# Patient Record
Sex: Female | Born: 1945 | Race: Black or African American | Hispanic: No | Smoking: Never smoker
Health system: Southern US, Community
[De-identification: ages and names within clinical notes are randomized; demographics above are authoritative.]

## PROBLEM LIST (undated history)

## (undated) DIAGNOSIS — I10 Essential (primary) hypertension: Secondary | ICD-10-CM

## (undated) DIAGNOSIS — G4730 Sleep apnea, unspecified: Secondary | ICD-10-CM

## (undated) HISTORY — PX: TONSILLECTOMY: SUR1361

## (undated) HISTORY — PX: ADENOIDECTOMY: SUR15

---

## 1998-04-29 ENCOUNTER — Ambulatory Visit: Admission: RE | Admit: 1998-04-29 | Discharge: 1998-04-29 | Payer: Self-pay | Admitting: *Deleted

## 1998-06-02 ENCOUNTER — Observation Stay: Admission: EM | Admit: 1998-06-02 | Discharge: 1998-06-03 | Payer: Self-pay | Admitting: *Deleted

## 1999-03-02 ENCOUNTER — Encounter: Payer: Self-pay | Admitting: Emergency Medicine

## 1999-03-02 ENCOUNTER — Emergency Department (HOSPITAL_COMMUNITY): Admission: EM | Admit: 1999-03-02 | Discharge: 1999-03-02 | Payer: Self-pay | Admitting: Emergency Medicine

## 1999-06-06 ENCOUNTER — Other Ambulatory Visit: Admission: RE | Admit: 1999-06-06 | Discharge: 1999-06-06 | Payer: Self-pay | Admitting: Obstetrics

## 2004-07-14 ENCOUNTER — Emergency Department (HOSPITAL_COMMUNITY): Admission: EM | Admit: 2004-07-14 | Discharge: 2004-07-14 | Payer: Self-pay | Admitting: Emergency Medicine

## 2006-03-21 IMAGING — CT CT PELVIS W/O CM
1 series · 16 of 32 positions shown, 20 images · non-contrast
Comparison: none

CLINICAL DATA: Left flank pain/hematuria.
TECHNIQUE: The study was done with kidney stone protocol.  No oral or IV contrast utilized. 
 CT ABDOMEN WITHOUT CONTRAST
 There are no definite renal calculi.  No hydronephrosis or perinephric stranding.  There are no left ureteral calculi evident.  On image #31, there is a question of a small right, nonobstructing, proximal ureteral calculus.  This could be an artifact.  This needs to be correlated clinically.  I am told that the patient's symptoms are left-sided. 
 Other organs unremarkable given the limitations to scanning without oral or IV contrast.

[Series 2: renal stone · axial · 0.78mm/px · z∈[-359,-49]mm · 16 of 70 slices shown, 20 images]
[im 5/70  soft-tissue]
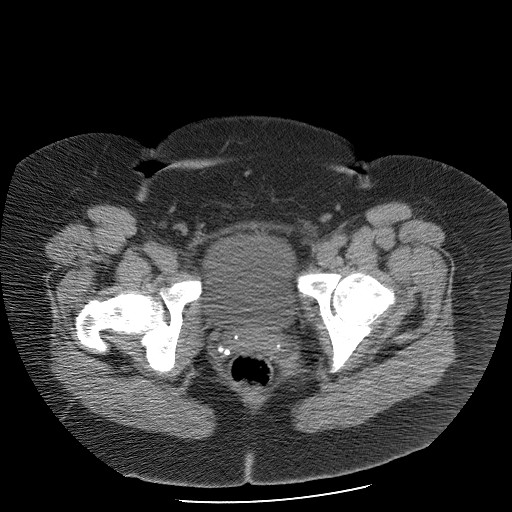
[im 5/70  bone]
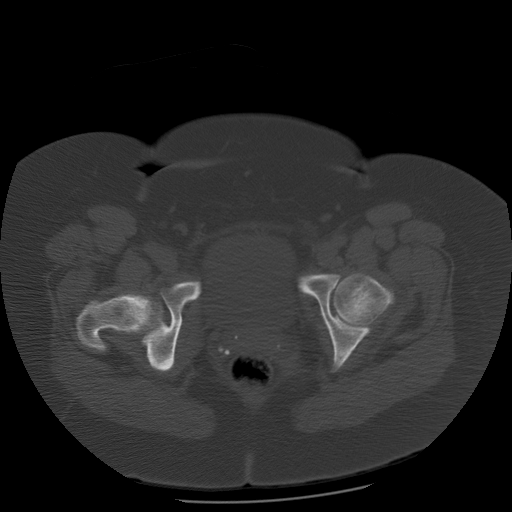
[im 9/70  soft-tissue]
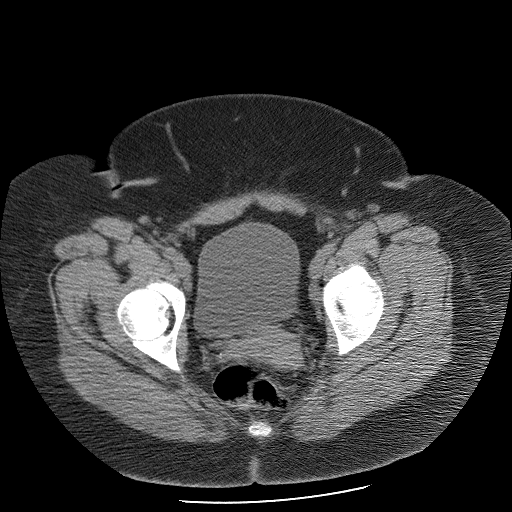
[im 14/70  soft-tissue]
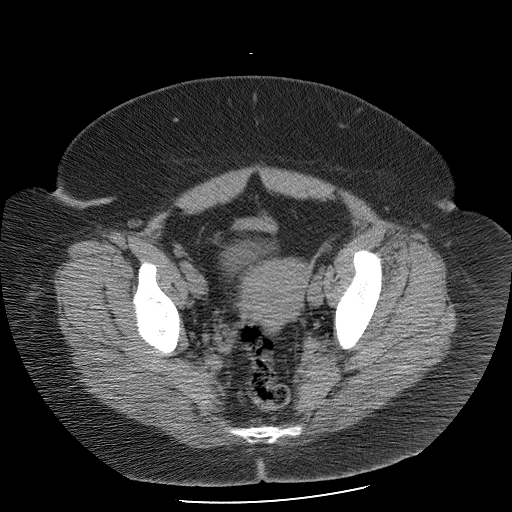
[im 18/70  soft-tissue]
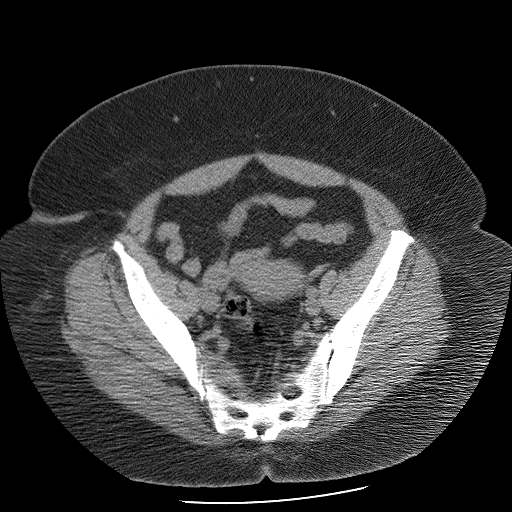
[im 23/70  soft-tissue]
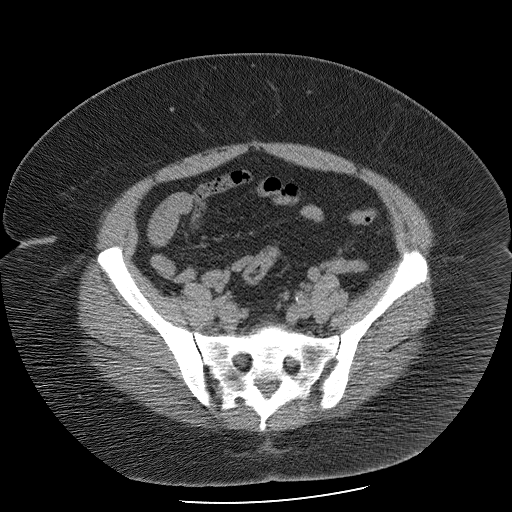
[im 27/70  soft-tissue]
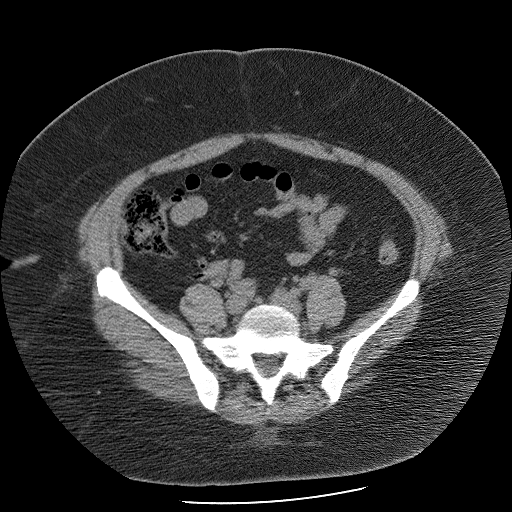
[im 32/70  soft-tissue]
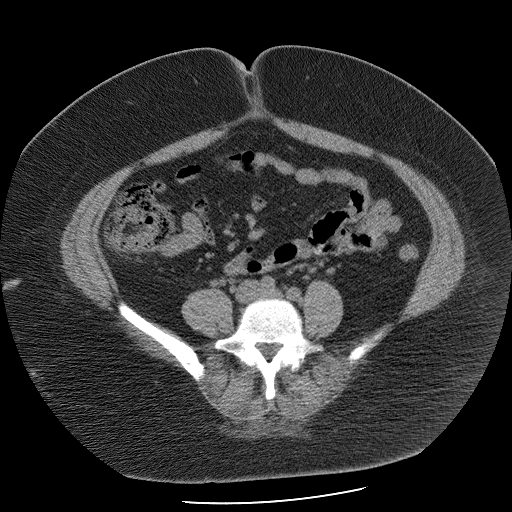
[im 38/70  soft-tissue]
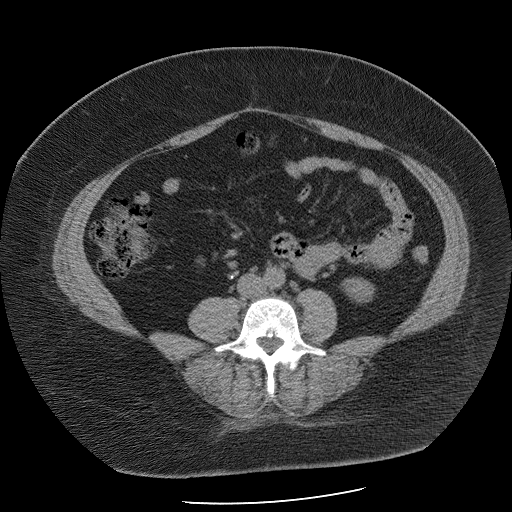
[im 43/70  soft-tissue]
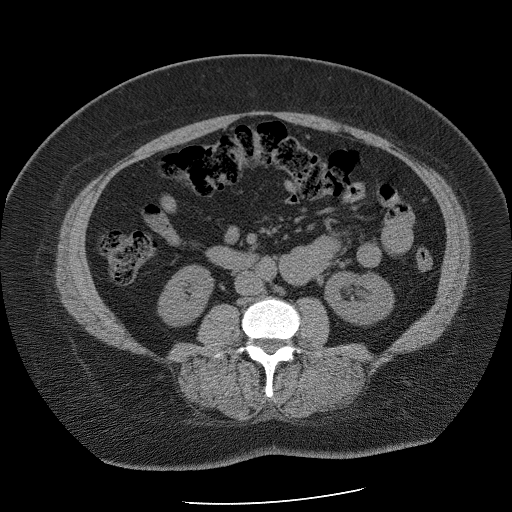
[im 43/70  bone]
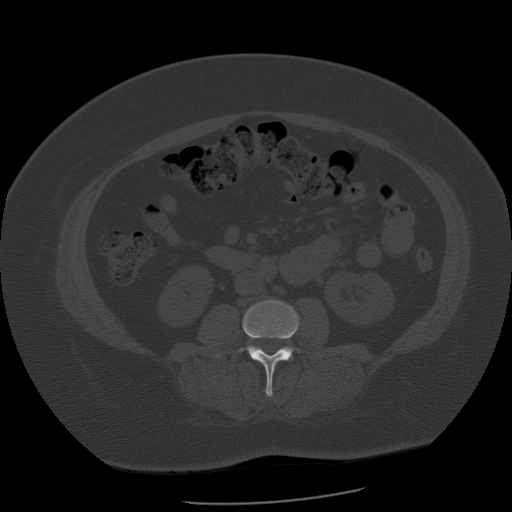
[im 47/70  soft-tissue]
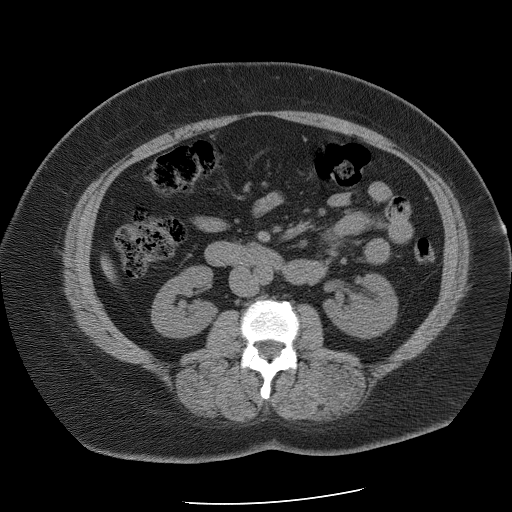
[im 52/70  soft-tissue]
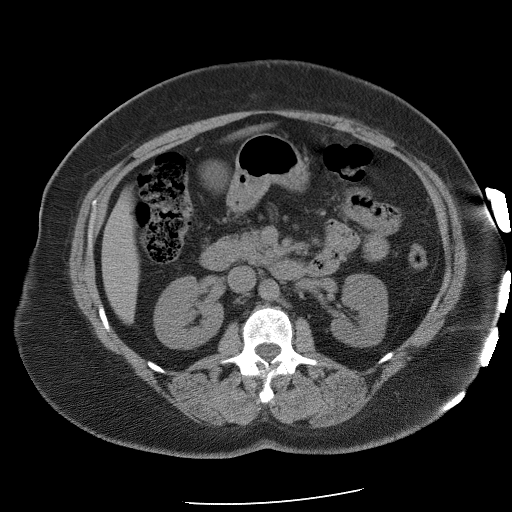
[im 56/70  soft-tissue]
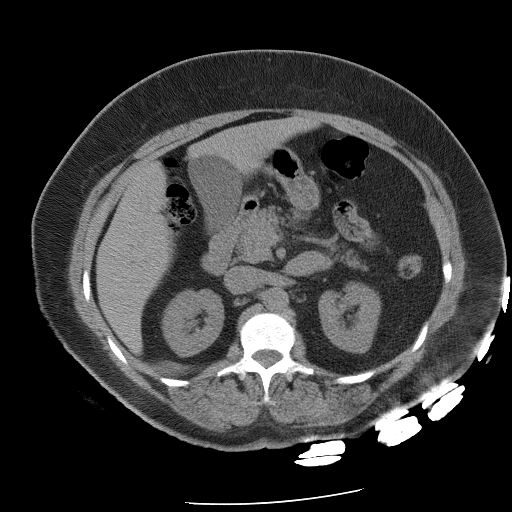
[im 61/70  soft-tissue]
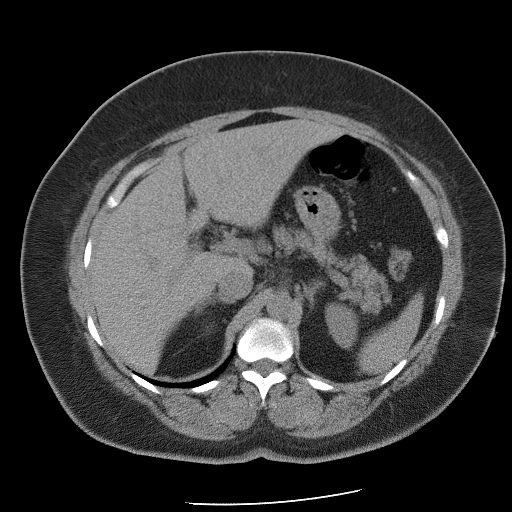
[im 61/70  lung]
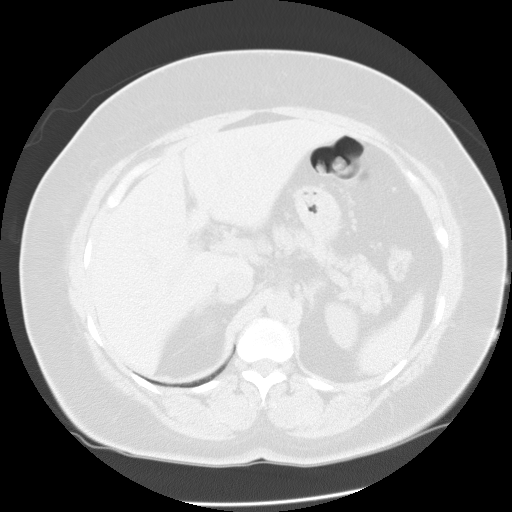
[im 63/70  lung]
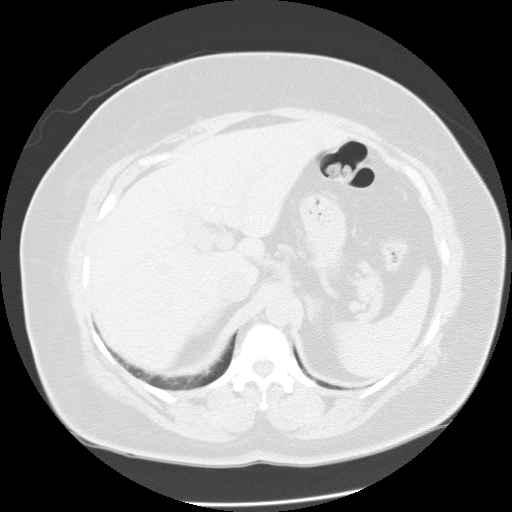
[im 65/70  soft-tissue]
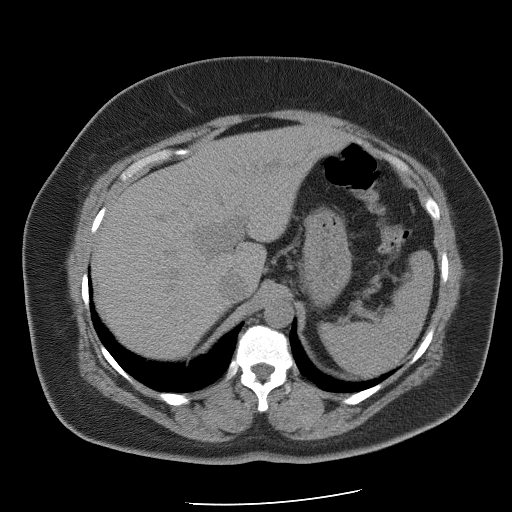
[im 65/70  lung]
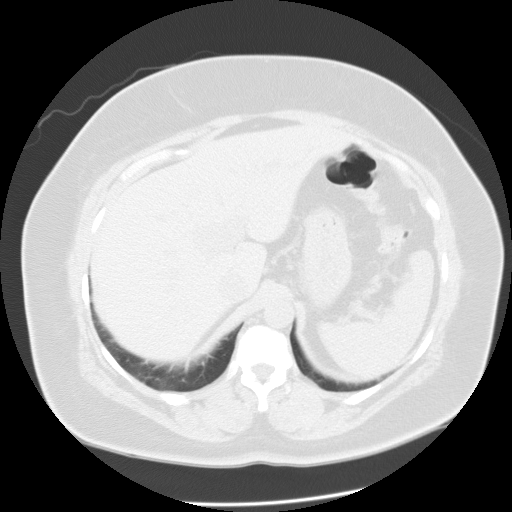
[im 67/70  lung]
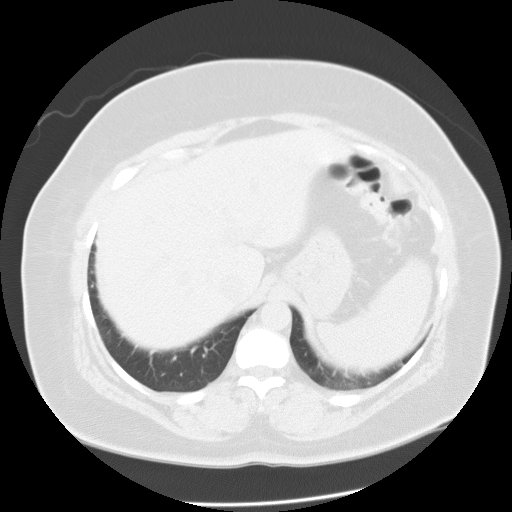

[16 of 32 positions shown; findings below may reference images not displayed]

IMPRESSION: Normal except for a possible 1-2 mm nonobstructing right upper ureteral calculus. 
 CT OF PELVIS WITHOUT CONTRAST
 No evidence for left ureteral dilatation or calculi.  No bladder stones.  Right ureter unremarkable.  There are some uterine calcifications and some phleboliths.  The appendix is air filled and normal.
IMPRESSION: No evidence for urinary tract calculi.  No significant findings.

## 2006-04-12 ENCOUNTER — Emergency Department (HOSPITAL_COMMUNITY): Admission: EM | Admit: 2006-04-12 | Discharge: 2006-04-12 | Payer: Self-pay | Admitting: Family Medicine

## 2006-09-02 ENCOUNTER — Emergency Department (HOSPITAL_COMMUNITY): Admission: EM | Admit: 2006-09-02 | Discharge: 2006-09-03 | Payer: Self-pay | Admitting: Emergency Medicine

## 2009-09-24 ENCOUNTER — Emergency Department (HOSPITAL_COMMUNITY): Admission: EM | Admit: 2009-09-24 | Discharge: 2009-09-24 | Payer: Self-pay | Admitting: Family Medicine

## 2011-03-22 LAB — POCT I-STAT, CHEM 8
BUN: 13 mg/dL (ref 6–23)
Calcium, Ion: 1.24 mmol/L (ref 1.12–1.32)
Chloride: 104 mEq/L (ref 96–112)
Glucose, Bld: 89 mg/dL (ref 70–99)

## 2011-06-01 IMAGING — CR DG CHEST 2V
2 series · 2 of 2 positions shown · non-contrast
Comparison: None.

CLINICAL DATA: Leg swelling

CHEST - 2 VIEW

[view not recorded (1 of 2)]
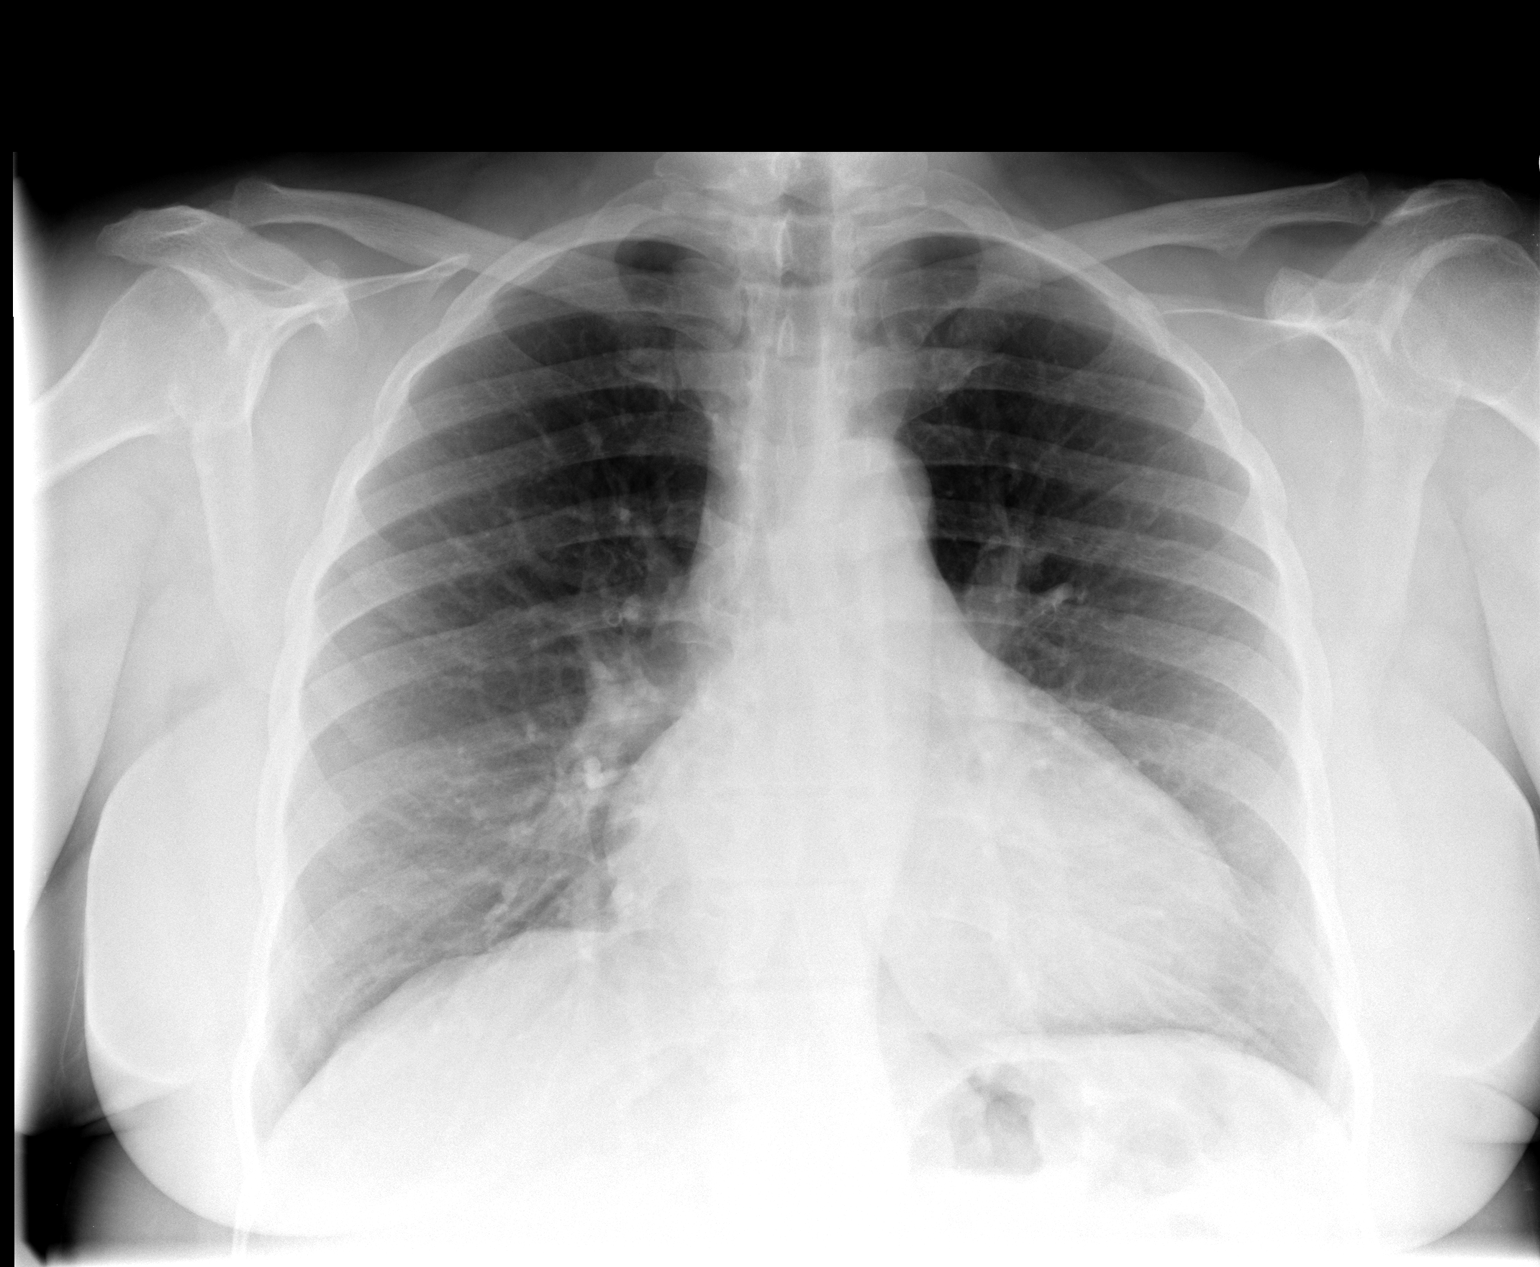

[view not recorded (2 of 2)]
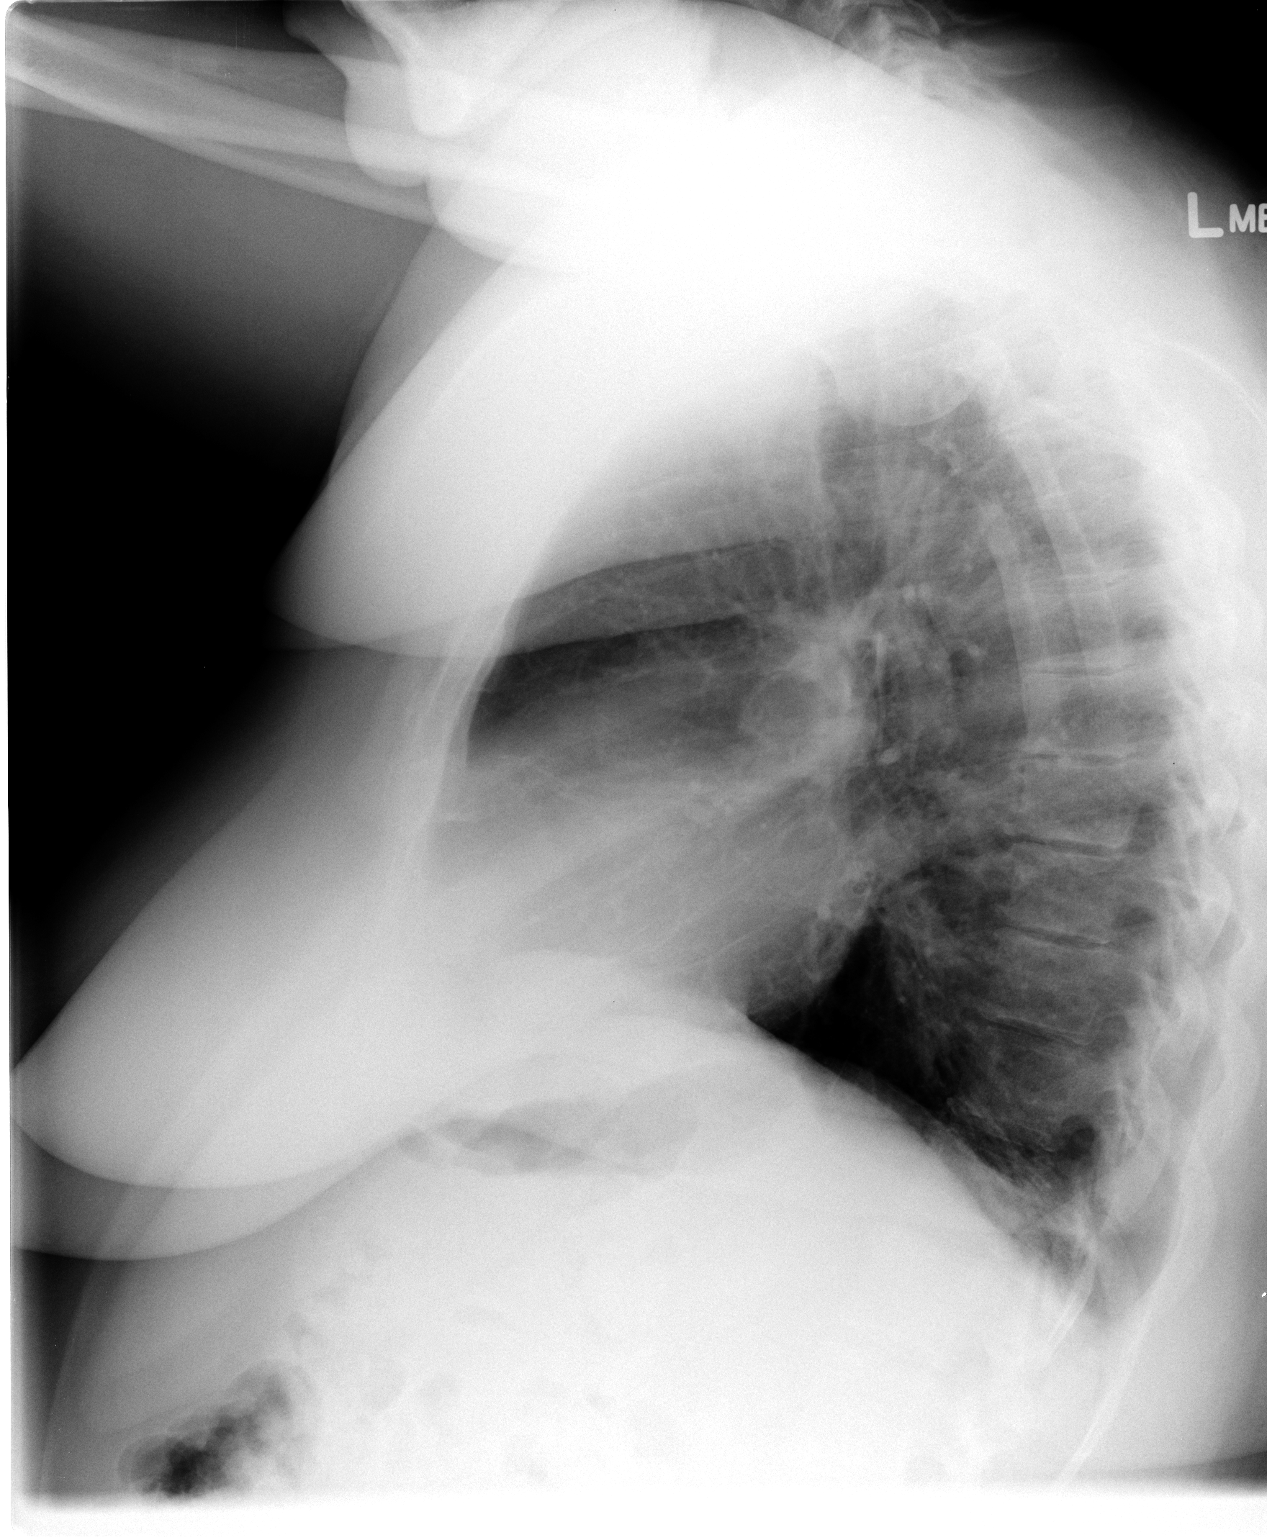

[2 of 2 positions shown; findings below may reference images not displayed]

FINDINGS: Lung volumes are low. The lungs are clear without focal
infiltrate, edema, pneumothorax or pleural effusion.
Cardiopericardial silhouette is at upper limits of normal for size.
Imaged bony structures of the thorax are intact.
IMPRESSION: Low volume film without acute cardiopulmonary findings.

## 2014-07-14 ENCOUNTER — Other Ambulatory Visit: Payer: Self-pay | Admitting: Internal Medicine

## 2014-07-14 DIAGNOSIS — E2839 Other primary ovarian failure: Secondary | ICD-10-CM

## 2014-12-22 ENCOUNTER — Other Ambulatory Visit: Payer: Self-pay

## 2018-10-02 ENCOUNTER — Other Ambulatory Visit: Payer: Self-pay

## 2018-10-02 ENCOUNTER — Emergency Department (HOSPITAL_COMMUNITY)
Admission: EM | Admit: 2018-10-02 | Discharge: 2018-10-02 | Disposition: A | Discharge status: Home / Self Care | Payer: Medicare HMO | Attending: Emergency Medicine | Admitting: Emergency Medicine

## 2018-10-02 ENCOUNTER — Encounter (HOSPITAL_COMMUNITY): Payer: Self-pay

## 2018-10-02 ENCOUNTER — Emergency Department (HOSPITAL_COMMUNITY): Payer: Medicare HMO

## 2018-10-02 DIAGNOSIS — Z9114 Patient's other noncompliance with medication regimen: Secondary | ICD-10-CM

## 2018-10-02 DIAGNOSIS — Z7982 Long term (current) use of aspirin: Secondary | ICD-10-CM | POA: Insufficient documentation

## 2018-10-02 DIAGNOSIS — I10 Essential (primary) hypertension: Secondary | ICD-10-CM

## 2018-10-02 DIAGNOSIS — I509 Heart failure, unspecified: Secondary | ICD-10-CM | POA: Diagnosis not present

## 2018-10-02 DIAGNOSIS — I110 Hypertensive heart disease with heart failure: Secondary | ICD-10-CM | POA: Insufficient documentation

## 2018-10-02 DIAGNOSIS — R0602 Shortness of breath: Secondary | ICD-10-CM | POA: Diagnosis present

## 2018-10-02 DIAGNOSIS — Z79899 Other long term (current) drug therapy: Secondary | ICD-10-CM | POA: Insufficient documentation

## 2018-10-02 DIAGNOSIS — Z91148 Patient's other noncompliance with medication regimen for other reason: Secondary | ICD-10-CM

## 2018-10-02 HISTORY — DX: Essential (primary) hypertension: I10

## 2018-10-02 HISTORY — DX: Sleep apnea, unspecified: G47.30

## 2018-10-02 LAB — COMPREHENSIVE METABOLIC PANEL
ALBUMIN: 3.6 g/dL (ref 3.5–5.0)
ALT: 26 U/L (ref 0–44)
ANION GAP: 9 (ref 5–15)
AST: 23 U/L (ref 15–41)
Alkaline Phosphatase: 80 U/L (ref 38–126)
BUN: 14 mg/dL (ref 8–23)
CHLORIDE: 104 mmol/L (ref 98–111)
CO2: 27 mmol/L (ref 22–32)
Calcium: 9.4 mg/dL (ref 8.9–10.3)
Creatinine, Ser: 0.78 mg/dL (ref 0.44–1.00)
GFR calc Af Amer: >60 mL/min (ref >60)
GFR calc non Af Amer: >60 mL/min (ref >60)
GLUCOSE: 106 mg/dL — AB (ref 70–99)
POTASSIUM: 4 mmol/L (ref 3.5–5.1)
Sodium: 140 mmol/L (ref 135–145)
Total Bilirubin: 0.4 mg/dL (ref 0.3–1.2)
Total Protein: 7.2 g/dL (ref 6.5–8.1)

## 2018-10-02 LAB — BRAIN NATRIURETIC PEPTIDE: B NATRIURETIC PEPTIDE 5: 539.8 pg/mL — AB (ref 0.0–100.0)

## 2018-10-02 LAB — CBC WITH DIFFERENTIAL/PLATELET
Abs Immature Granulocytes: 0.01 10*3/uL (ref 0.00–0.07)
BASOS ABS: 0 10*3/uL (ref 0.0–0.1)
BASOS PCT: 1 %
EOS PCT: 2 %
Eosinophils Absolute: 0.1 10*3/uL (ref 0.0–0.5)
HCT: 41.1 % (ref 36.0–46.0)
HEMOGLOBIN: 12.6 g/dL (ref 12.0–15.0)
Immature Granulocytes: 0 %
LYMPHS PCT: 25 %
Lymphs Abs: 1.6 10*3/uL (ref 0.7–4.0)
MCH: 28 pg (ref 26.0–34.0)
MCHC: 30.7 g/dL (ref 30.0–36.0)
MCV: 91.3 fL (ref 80.0–100.0)
Monocytes Absolute: 0.6 10*3/uL (ref 0.1–1.0)
Monocytes Relative: 9 %
NEUTROS ABS: 4.1 10*3/uL (ref 1.7–7.7)
NRBC: 0 % (ref 0.0–0.2)
Neutrophils Relative %: 63 %
PLATELETS: 304 10*3/uL (ref 150–400)
RBC: 4.5 MIL/uL (ref 3.87–5.11)
RDW: 14.9 % (ref 11.5–15.5)
WBC: 6.4 10*3/uL (ref 4.0–10.5)

## 2018-10-02 LAB — TROPONIN I: Troponin I: <0.03 ng/mL (ref <0.03)

## 2018-10-02 LAB — D-DIMER, QUANTITATIVE: D-Dimer, Quant: 0.53 ug/mL-FEU — ABNORMAL HIGH (ref 0.00–0.50)

## 2018-10-02 MED ORDER — FUROSEMIDE 10 MG/ML IJ SOLN
20.0000 mg | Freq: Once | INTRAMUSCULAR | Status: AC
  Administered 2018-10-02: 20 mg via INTRAVENOUS
  Filled 2018-10-02: qty 4

## 2018-10-02 MED ORDER — VALSARTAN 40 MG PO TABS: 40.0000 mg | ORAL_TABLET | Freq: Two times a day (BID) | ORAL | 1 refills | Status: AC

## 2018-10-02 MED ORDER — ALBUTEROL SULFATE (2.5 MG/3ML) 0.083% IN NEBU: 5.0000 mg | INHALATION_SOLUTION | Freq: Once | RESPIRATORY_TRACT | Status: DC

## 2018-10-02 MED ORDER — FUROSEMIDE 20 MG PO TABS: 20.0000 mg | ORAL_TABLET | Freq: Two times a day (BID) | ORAL | 1 refills | Status: AC

## 2018-10-02 NOTE — ED Notes (Signed)
Pt is alert and oriented x 4 and is verbally responsive. Pt denies pain at this time. Pt dtr and grandtr are at bedside. Pt has bilateral LE edema.

## 2018-10-02 NOTE — Discharge Instructions (Addendum)
Your blood pressure today is very high, causing you to have heart failure.  It would be better to stay in the hospital but you elected to be discharged with medications sent to your pharmacy.  Please take them as directed.  Try to avoid any added salt in your diet.  See the attached instructions on a appropriate diet for high blood pressure.  Return here, if needed for problems.

## 2018-10-02 NOTE — ED Provider Notes (Signed)
[facility]-EMERGENCY DEPT Provider Note   CSN: [id number] Arrival date & time: 10/02/18  1025     History   Chief Complaint Chief Complaint  Patient presents with  . Shortness of Breath    HPI Shirley Wheeler is a 72 y.o. female.  HPI   She presents for evaluation of shortness of breath, difficulty sleeping at night because getting out of breath, dyspnea on exertion, and chest tightness for 1 week.  The chest tightness comes and goes.  She denies diaphoresis, vomiting, cough, weakness or dizziness.  She stopped taking her blood pressure medicine several months ago, by choice.  Has not seen her doctor recently for anything.  There are no other no modifying factors.  Past Medical History:  Diagnosis Date  . Hypertension   . Sleep apnea     There are no active problems to display for this patient.   Past Surgical History:  Procedure Laterality Date  . ADENOIDECTOMY    . TONSILLECTOMY       OB History   None      Home Medications    Prior to Admission medications   Medication Sig Start Date End Date Taking? Authorizing Provider  amLODipine (NORVASC) 10 MG tablet Take 10 mg by mouth daily.   Yes [provider]  aspirin 325 MG EC tablet Take 325 mg by mouth daily.   Yes [provider]  Aspirin-Acetaminophen-Caffeine [phone] MG PACK Take 1 Package by mouth daily as needed (headache).    Yes [provider]  Multiple Vitamin (MULTIVITAMIN WITH MINERALS) TABS tablet Take 1 tablet by mouth daily.   Yes [provider]  furosemide (LASIX) 20 MG tablet Take 1 tablet (20 mg total) by mouth 2 (two) times daily. 10/02/18   Mancel Bale, MD  valsartan (DIOVAN) 40 MG tablet Take 1 tablet (40 mg total) by mouth 2 (two) times daily. 10/02/18   Mancel Bale, MD    Family History Family History  Problem Relation Age of Onset  . Hypertension Mother     Social History Social History   Tobacco Use  . Smoking  status: Never Smoker  . Smokeless tobacco: Never Used  Substance Use Topics  . Alcohol use: Never    Frequency: Never  . Drug use: Never     Allergies   Patient has no known allergies.   Review of Systems Review of Systems  All other systems reviewed and are negative.    Physical Exam Updated Vital Signs BP (!) 170/118   Pulse (!) 113   Temp 97.7 F (36.5 C) (Oral)   Resp 19   Ht 5\' 5"  (1.651 m)   Wt 113.4 kg   SpO2 97%   BMI 41.60 kg/m   Physical Exam  Constitutional: She is oriented to person, place, and time. She appears well-developed and well-nourished.  HENT:  Head: Normocephalic and atraumatic.  Eyes: Pupils are equal, round, and reactive to light. Conjunctivae and EOM are normal.  Neck: Normal range of motion and phonation normal. Neck supple.  Cardiovascular: Normal rate and regular rhythm.  Pulmonary/Chest: Effort normal. No respiratory distress. She has decreased breath sounds in the right lower field and the left lower field. She has no wheezes. She has no rhonchi. She has no rales. She exhibits no tenderness.  Abdominal: Soft. She exhibits no distension. There is no tenderness. There is no guarding.  Musculoskeletal: Normal range of motion.       Right lower leg: She exhibits  edema. She exhibits no tenderness.       Left lower leg: She exhibits edema. She exhibits no tenderness.  Neurological: She is alert and oriented to person, place, and time. She exhibits normal muscle tone.  Skin: Skin is warm and dry.  Psychiatric: She has a normal mood and affect. Her behavior is normal. Judgment and thought content normal.  Nursing note and vitals reviewed.    ED Treatments / Results  Labs (all labs ordered are listed, but only abnormal results are displayed) Labs Reviewed  COMPREHENSIVE METABOLIC PANEL - Abnormal; Notable for the following components:      Result Value   Glucose, Bld 106 (*)    All other components within normal limits  D-DIMER,  QUANTITATIVE (NOT AT [facility]) - Abnormal; Notable for the following components:   D-Dimer, Quant 0.53 (*)    All other components within normal limits  BRAIN NATRIURETIC PEPTIDE - Abnormal; Notable for the following components:   B Natriuretic Peptide 539.8 (*)    All other components within normal limits  TROPONIN I  CBC WITH DIFFERENTIAL/PLATELET    EKG EKG Interpretation  Date/Time:  Thursday October 02 2018 10:33:58 EDT Ventricular Rate:  112 PR Interval:    QRS Duration: 108 QT Interval:  346 QTC Calculation: 473 R Axis:   -40 Text Interpretation:  Sinus tachycardia Incomplete left bundle branch block Low voltage, extremity and precordial leads Baseline wander in lead(s) V2 V3 V6 Since last tracing rate faster Reconfirmed by Mancel Bale [phone]) on 10/02/2018 10:39:47 AM Also confirmed by Mancel Bale [phone]), editor Elita Quick ([id number])  on 10/02/2018 2:41:19 PM   Radiology Dg Chest 2 View  Result Date: 10/02/2018 CLINICAL DATA:  Shortness of breath. EXAM: CHEST - 2 VIEW COMPARISON:  09/24/2009. FINDINGS: Cardiomegaly with pulmonary vascular prominence and bilateral interstitial prominence. Bilateral pleural effusions. Findings consistent CHF. IMPRESSION: Congestive heart failure bilateral interstitial edema and bilateral pleural effusions. Electronically Signed   By: Maisie Fus  Register   On: 10/02/2018 11:14    Procedures .Critical Care Performed by: Mancel Bale, MD Authorized by: Mancel Bale, MD   Critical care provider statement:    Critical care time (minutes):  45   Critical care start time:  10/02/2018 10:40 AM   Critical care end time:  10/02/2018 3:24 PM   Critical care time was exclusive of:  Separately billable procedures and treating other patients   Critical care was necessary to treat or prevent imminent or life-threatening deterioration of the following conditions:  Circulatory failure   Critical care was time spent personally by me on the  following activities:  Blood draw for specimens, development of treatment plan with patient or surrogate, discussions with consultants, evaluation of patient's response to treatment, examination of patient, obtaining history from patient or surrogate, ordering and performing treatments and interventions, ordering and review of laboratory studies, pulse oximetry, re-evaluation of patient's condition, review of old charts and ordering and review of radiographic studies   (including critical care time)  Medications Ordered in ED Medications  furosemide (LASIX) injection 20 mg (20 mg Intravenous Given 10/02/18 1147)     Initial Impression / Assessment and Plan / ED Course  I have reviewed the triage vital signs and the nursing notes.  Pertinent labs & imaging results that were available during my care of the patient were reviewed by me and considered in my medical decision making (see chart for details).  Clinical Course as of Oct 02 1526  Thu Oct 02, 2018  1320  Elevated  Brain natriuretic peptide(!) [EW]  1320 Normal  CBC with Differential [EW]  1320 Mild elevation, normal age-adjusted  D-dimer, quantitative(!) [EW]  1321 Normal  Troponin I [EW]  1321 Normal except glucose minimally elevated  Comprehensive metabolic panel(!) [EW]  1321 Consistent with mild CHF without infiltrate.  DG Chest 2 View [EW]  1339 She reports vomiting several times since being treated with Lasix.  She remains tachypneic.  She is no longer tachycardic.  Repeat blood pressure 169/115 at this time.  Patient would like to consider going home.  I have advised that she should stay and be treated further for congestive heart failure with hypertensive urgency.   [EW]  1524 Patient has been offered several times to be admitted and received additional treatment for high blood pressure with secondary heart failure.  She is instructed on the risk of discharge, without aggressively treating these problems.  She continues to  wish to go home.  Her daughter is with her and agrees with the patient leaving at this time.  Her daughter did not want the patient have either lisinopril or Norvasc, because of her perceived risk for these medications.  Patient and daughter agreed to take valsartan, for high blood pressure along with Lasix.  He will follow-up with PCP next week and return here if needed.   [EW]    Clinical Course User Index [EW] Mancel Bale, MD     Patient Vitals for the past 24 hrs:  BP Temp Temp src Pulse Resp SpO2 Height Weight  10/02/18 1130 (!) 170/118 - - (!) 113 19 97 % - -  10/02/18 1045 (!) 189/129 - - 98 (!) 32 96 % - -  10/02/18 1034 - - - - (!) 28 - - -  10/02/18 1031 - - - - - - 5\' 5"  (1.651 m) 113.4 kg  10/02/18 1029 (!) 174/119 97.7 F (36.5 C) Oral (!) 118 18 94 % - -    3:25 PM Reevaluation with update and discussion. After initial assessment and treatment, an updated evaluation reveals she states she has voided several times and feels better.  Findings discussed with patient and daughter.  Patient does not want to be admitted at this time despite my advice to do that.  All questions were answered. Mancel Bale   Medical Decision Making: Hypertension secondary to medication noncompliance, and possibly advancing need for treatment.  Patient with secondary heart failure, with normal oxygenation.  Doubt ACS, PE or pneumonia.  CRITICAL CARE-yes Performed by: Mancel Bale   Nursing Notes Reviewed/ Care Coordinated Applicable Imaging Reviewed Interpretation of Laboratory Data incorporated into ED treatment  The patient appears reasonably screened and/or stabilized for discharge and I doubt any other medical condition or other [facility] requiring further screening, evaluation, or treatment in the ED at this time prior to discharge.  Plan: Home Medications-prescription sent to the patient's pharmacy, at her request continue home medicines.; Home Treatments-low-salt diet; return here if the  recommended treatment, does not improve the symptoms; Recommended follow up-PCP checkup next week and return here if needed.     Final Clinical Impressions(s) / ED Diagnoses   Final diagnoses:  Hypertension, unspecified type  Acute heart failure, unspecified heart failure type (HCC)  Noncompliance with medication regimen    ED Discharge Orders         Ordered    valsartan (DIOVAN) 40 MG tablet  2 times daily     10/02/18 1522    furosemide (LASIX) 20 MG tablet  2 times daily     10/02/18 1522           Mancel Bale, MD 10/02/18 1527

## 2018-10-02 NOTE — ED Notes (Addendum)
Pt removed IV, and all other monitoring equipmemt and stated " I'm ready to go home"

## 2018-10-02 NOTE — ED Notes (Signed)
Called lab regarding blood results, tests are pending at this time.

## 2018-10-02 NOTE — ED Triage Notes (Signed)
Patient c/o SOB x 1 week and reports since SOB  She has felt like her heart was fluttering.

## 2019-01-17 DEATH — deceased
